# Patient Record
Sex: Female | Born: 1990 | Hispanic: Yes | Smoking: Never smoker
Health system: Southern US, Community
[De-identification: ages and names within clinical notes are randomized; demographics above are authoritative.]

## PROBLEM LIST (undated history)

## (undated) HISTORY — PX: ECTOPIC PREGNANCY SURGERY: SHX613

## (undated) HISTORY — PX: OTHER SURGICAL HISTORY: SHX169

---

## 2013-04-11 ENCOUNTER — Emergency Department: Payer: Self-pay | Admitting: Emergency Medicine

## 2013-04-11 LAB — CBC
HCT: 45.2 % (ref 35.0–47.0)
HGB: 15.9 g/dL (ref 12.0–16.0)
MCH: 30.9 pg (ref 26.0–34.0)
Platelet: 243 10*3/uL (ref 150–440)
RBC: 5.13 10*6/uL (ref 3.80–5.20)
RDW: 13.4 % (ref 11.5–14.5)
WBC: 8.4 10*3/uL (ref 3.6–11.0)

## 2013-04-11 LAB — URINALYSIS, COMPLETE
Bilirubin,UR: NEGATIVE
Blood: NEGATIVE
Glucose,UR: NEGATIVE mg/dL (ref 0–75)
Ketone: NEGATIVE
Nitrite: NEGATIVE
Ph: 6 (ref 4.5–8.0)
Specific Gravity: 1.02 (ref 1.003–1.030)
Squamous Epithelial: 2
WBC UR: <1 /HPF (ref 0–5)

## 2013-04-11 LAB — COMPREHENSIVE METABOLIC PANEL
Albumin: 3.9 g/dL (ref 3.4–5.0)
Bilirubin,Total: 0.3 mg/dL (ref 0.2–1.0)
Chloride: 104 mmol/L (ref 98–107)
Co2: 29 mmol/L (ref 21–32)
EGFR (Non-African Amer.): >60
Glucose: 111 mg/dL — ABNORMAL HIGH (ref 65–99)
Potassium: 3.5 mmol/L (ref 3.5–5.1)
SGOT(AST): 98 U/L — ABNORMAL HIGH (ref 15–37)
SGPT (ALT): 174 U/L — ABNORMAL HIGH (ref 12–78)
Sodium: 138 mmol/L (ref 136–145)
Total Protein: 8.2 g/dL (ref 6.4–8.2)

## 2013-04-11 LAB — LIPASE, BLOOD: Lipase: 113 U/L (ref 73–393)

## 2013-04-12 LAB — GC/CHLAMYDIA PROBE AMP

## 2015-09-19 ENCOUNTER — Encounter: Payer: Self-pay | Admitting: Obstetrics and Gynecology

## 2017-09-23 ENCOUNTER — Encounter: Payer: Self-pay | Admitting: Obstetrics & Gynecology

## 2017-09-23 ENCOUNTER — Ambulatory Visit (INDEPENDENT_AMBULATORY_CARE_PROVIDER_SITE_OTHER): Payer: Self-pay | Admitting: Obstetrics & Gynecology

## 2017-09-23 VITALS — BP 120/80 | HR 80 | Ht 65.0 in | Wt 250.0 lb

## 2017-09-23 DIAGNOSIS — Z124 Encounter for screening for malignant neoplasm of cervix: Secondary | ICD-10-CM

## 2017-09-23 DIAGNOSIS — Z3169 Encounter for other general counseling and advice on procreation: Secondary | ICD-10-CM | POA: Insufficient documentation

## 2017-09-23 DIAGNOSIS — Z6841 Body Mass Index (BMI) 40.0 and over, adult: Secondary | ICD-10-CM

## 2017-09-23 DIAGNOSIS — N970 Female infertility associated with anovulation: Secondary | ICD-10-CM | POA: Insufficient documentation

## 2017-09-23 MED ORDER — PHENTERMINE HCL 37.5 MG PO TABS: 37.5000 mg | ORAL_TABLET | Freq: Every day | ORAL | 0 refills | Status: DC

## 2017-09-23 NOTE — Patient Instructions (Signed)
Hysterosalpingography Hysterosalpingography is an X-ray exam of the womb (uterus) and fallopian tubes. It can help a doctor find out why a woman is not able to have kids. It can also find lumps (tumors) and other things that are not normal. The test lasts about 15-30 minutes. What happens before the procedure?  Schedule the test between day 5 and day 10 of your last period. Day 1 is the first day of your period.  Ask your doctor about changing or stopping your medicines.  Eat and drink as normal.  Pee (urinate) before the test begins. What happens during the procedure?  You may be given pain medicine or a medicine to relax you (sedative).  You will lie down on an X-ray table with your feet in stirrups.  A device (speculum) will be placed into your vagina. This lets your doctor see inside your vagina to the cervix.  Your cervix will be washed with a soap.  A thin tube will be passed through the cervix and into the womb.  X-ray dye (contrast) will be put into this tube.  X-rays will be taken of the womb and fallopian tubes.  The tube will be taken out. What happens after the procedure?  Most of the fluid will flow out on its own. Wear a pad if needed.  You may have cramping and a little bleeding. This should go away in 24 hours.  Ask when your test results will be ready. Make sure you get your test results. This information is not intended to replace advice given to you by your health care provider. Make sure you discuss any questions you have with your health care provider. Document Released: 08/16/2010 Document Revised: 12/20/2015 Document Reviewed: 01/14/2013  Phentermine tablets or capsules What is this medicine? PHENTERMINE (FEN ter meen) decreases your appetite. It is used with a reduced calorie diet and exercise to help you lose weight. This medicine may be used for other purposes; ask your health care provider or pharmacist if you have questions. COMMON BRAND NAME(S):  Adipex-P, Atti-Plex P, Atti-Plex P Spansule, Fastin, Lomaira, Pro-Fast, Tara-8 What should I tell my health care provider before I take this medicine? They need to know if you have any of these conditions: -agitation -glaucoma -heart disease -high blood pressure -history of substance abuse -lung disease called Primary Pulmonary Hypertension (PPH) -taken an MAOI like Carbex, Eldepryl, Marplan, Nardil, or Parnate in last 14 days -thyroid disease -an unusual or allergic reaction to phentermine, other medicines, foods, dyes, or preservatives -pregnant or trying to get pregnant -breast-feeding How should I use this medicine? Take this medicine by mouth with a glass of water. Follow the directions on the prescription label. The instructions for use may differ based on the product and dose you are taking. Avoid taking this medicine in the evening. It may interfere with sleep. Take your doses at regular intervals. Do not take your medicine more often than directed. Talk to your pediatrician regarding the use of this medicine in children. While this drug may be prescribed for children 17 years or older for selected conditions, precautions do apply. Overdosage: If you think you have taken too much of this medicine contact a poison control center or emergency room at once. NOTE: This medicine is only for you. Do not share this medicine with others. What if I miss a dose? If you miss a dose, take it as soon as you can. If it is almost time for your next dose, take only that dose. Do  not take double or extra doses. What may interact with this medicine? Do not take this medicine with any of the following medications: -duloxetine -MAOIs like Carbex, Eldepryl, Marplan, Nardil, and Parnate -medicines for colds or breathing difficulties like pseudoephedrine or phenylephrine -procarbazine -sibutramine -SSRIs like citalopram, escitalopram, fluoxetine, fluvoxamine, paroxetine, and sertraline -stimulants like  dexmethylphenidate, methylphenidate or modafinil -venlafaxine This medicine may also interact with the following medications: -medicines for diabetes This list may not describe all possible interactions. Give your health care provider a list of all the medicines, herbs, non-prescription drugs, or dietary supplements you use. Also tell them if you smoke, drink alcohol, or use illegal drugs. Some items may interact with your medicine. What should I watch for while using this medicine? Notify your physician immediately if you become short of breath while doing your normal activities. Do not take this medicine within 6 hours of bedtime. It can keep you from getting to sleep. Avoid drinks that contain caffeine and try to stick to a regular bedtime every night. This medicine was intended to be used in addition to a healthy diet and exercise. The best results are achieved this way. This medicine is only indicated for short-term use. Eventually your weight loss may level out. At that point, the drug will only help you maintain your new weight. Do not increase or in any way change your dose without consulting your doctor. You may get drowsy or dizzy. Do not drive, use machinery, or do anything that needs mental alertness until you know how this medicine affects you. Do not stand or sit up quickly, especially if you are an older patient. This reduces the risk of dizzy or fainting spells. Alcohol may increase dizziness and drowsiness. Avoid alcoholic drinks. What side effects may I notice from receiving this medicine? Side effects that you should report to your doctor or health care professional as soon as possible: -chest pain, palpitations -depression or severe changes in mood -increased blood pressure -irritability -nervousness or restlessness -severe dizziness -shortness of breath -problems urinating -unusual swelling of the legs -vomiting Side effects that usually do not require medical attention  (report to your doctor or health care professional if they continue or are bothersome): -blurred vision or other eye problems -changes in sexual ability or desire -constipation or diarrhea -difficulty sleeping -dry mouth or unpleasant taste -headache -nausea This list may not describe all possible side effects. Call your doctor for medical advice about side effects. You may report side effects to FDA at 1-800-FDA-1088. Where should I keep my medicine? Keep out of the reach of children. This medicine can be abused. Keep your medicine in a safe place to protect it from theft. Do not share this medicine with anyone. Selling or giving away this medicine is dangerous and against the law. This medicine may cause accidental overdose and death if taken by other adults, children, or pets. Mix any unused medicine with a substance like cat litter or coffee grounds. Then throw the medicine away in a sealed container like a sealed bag or a coffee can with a lid. Do not use the medicine after the expiration date. Store at room temperature between 20 and 25 degrees C (68 and 77 degrees F). Keep container tightly closed. NOTE: This sheet is a summary. It may not cover all possible information. If you have questions about this medicine, talk to your doctor, pharmacist, or health care provider.  2018 Elsevier/Gold Standard (2015-04-20 12:53:15)    Elsevier Interactive Patient Education  2017 Elsevier  Inc.  

## 2017-09-23 NOTE — Progress Notes (Signed)
Gynecology Infertility Exam  PCP: Patient, No Pcp Per  Chief Complaint:  Chief Complaint  Patient presents with  . Infertility   History of Present Illness: Patient is a 27 y.o. G1P0010 presenting for evaluation of infertility. Patient and partner have been attempting conception for several years. Marital Status: married for 2 or 3 years. Pregnancies with current partner yes    Pt had 2017 LEFT ECTOPIC PREGNANCY treated by Lap LS. Last few periods have been scanty bleeding but reg  Menstrual and Endocrine History LMP: Patient's last menstrual period was 09/16/2017. Menarche:13 Shortest Interval: 24 Longest Interval: 32  days Duration of flow: a few days Heavy Menses: no Clots: no Intermenstrual Bleeding: no Postcoital Bleeding: no Dysmenorrhea: no Amenorrhea: not applicable Wt Change: yes, started at young age problems w obesity; recent 10 lb weight loss (trying) Hirsutism: no Balding: no Acne: no Galactorrhea: no  Obstetrical History LEFT ECTOPIC 2017  Gynecologic History Last PAP: 2017 Previous abdominal or pelvic surgery: yes Pelvic Pain:  no Endometriosis: no Hot Flashes: no DES Exposure: no Abnormal Pap: no Cervix Cryo/cone: no STD: no PID: no  Infertility and Endocrine Studies BBT: no Endo. Bx.:no HSG: no PCT: no Laparoscopy: no Hormonal Studies: no Semen analysis: no Other Studies: no Meds: none Other Therapies: Not applicable Insemination:not applicable  Sexual History Frequency: several times per month(s) Satisfied: yes Dyspareunia: no Use of Lubricant: no Douching: no Number of lifetime sex partners: 1  Contraception None  Family History Thyroid Problems: no Heart Condition or High Blood Pressure: no Blood Clot or Stroke: no Diabetes: no Cancer: no Birth Defects/Inherited diseases:no Infectious diseases (mumps, TB, Rubella):no Other Medical Problems: no MR/autism/fragile X or POF: no  Habits Cigarettes:    Wife -  no  Husband - no Alcohol:    Wife -  no    Husband - no Marijuana: no  PMHx: She  has no past medical history on file. Also,  has a past surgical history that includes Ectopic pregnancy surgery and left tube removal., family history includes Breast cancer in her maternal aunt.,  reports that  has never smoked. she has never used smokeless tobacco. She reports that she does not drink alcohol or use drugs.  She has a current medication list which includes the following prescription(s): phentermine. Also, has No Known Allergies.  Review of Systems  Constitutional: Negative for chills, fever and malaise/fatigue.  HENT: Negative for congestion, sinus pain and sore throat.   Eyes: Negative for blurred vision and pain.  Respiratory: Negative for cough and wheezing.   Cardiovascular: Negative for chest pain and leg swelling.  Gastrointestinal: Negative for abdominal pain, constipation, diarrhea, heartburn, nausea and vomiting.  Genitourinary: Negative for dysuria, frequency, hematuria and urgency.  Musculoskeletal: Negative for back pain, joint pain, myalgias and neck pain.  Skin: Negative for itching and rash.  Neurological: Negative for dizziness, tremors and weakness.  Endo/Heme/Allergies: Does not bruise/bleed easily.  Psychiatric/Behavioral: Negative for depression. The patient is not nervous/anxious and does not have insomnia.     Objective: BP 120/80   Pulse 80   Ht 5\' 5"  (1.651 m)   Wt 250 lb (113.4 kg)   LMP 09/16/2017   BMI 41.60 kg/m  Physical Exam  Constitutional: She is oriented to person, place, and time. She appears well-developed and well-nourished. No distress.  Genitourinary: Rectum normal, vagina normal and uterus normal. Pelvic exam was performed with patient supine. There is no rash or lesion on the right labia. There is no rash or lesion  on the left labia. Vagina exhibits no lesion. No bleeding in the vagina. Right adnexum does not display mass and does not display  tenderness. Left adnexum does not display mass and does not display tenderness. Cervix does not exhibit motion tenderness, lesion, friability or polyp.   Uterus is mobile and midaxial. Uterus is not enlarged or exhibiting a mass.  HENT:  Head: Normocephalic and atraumatic. Head is without laceration.  Right Ear: Hearing normal.  Left Ear: Hearing normal.  Nose: No epistaxis.  No foreign bodies.  Mouth/Throat: Uvula is midline, oropharynx is clear and moist and mucous membranes are normal.  Eyes: Pupils are equal, round, and reactive to light.  Neck: Normal range of motion. Neck supple. No thyromegaly present.  Cardiovascular: Normal rate and regular rhythm. Exam reveals no gallop and no friction rub.  No murmur heard. Pulmonary/Chest: Effort normal and breath sounds normal. No respiratory distress. She has no wheezes. Right breast exhibits no mass, no skin change and no tenderness. Left breast exhibits no mass, no skin change and no tenderness.  Abdominal: Soft. Bowel sounds are normal. She exhibits no distension. There is no tenderness. There is no rebound.  Musculoskeletal: Normal range of motion.  Neurological: She is alert and oriented to person, place, and time. No cranial nerve deficit.  Skin: Skin is warm and dry.  Psychiatric: She has a normal mood and affect. Judgment normal.  Vitals reviewed.  Assessment: 27 y.o. G1P0010 1. Infertility associated with anovulation - DG Hysterogram (HSG); Future; especially due to prior tubal surgery and ectopic - DHEA-sulfate - FSH/LH - Hemoglobin A1c - Prolactin - Testosterone, Free, Total, SHBG - TSH  2. Class 3 severe obesity without serious comorbidity with body mass index (BMI) of 40.0 to 44.9 in adult, unspecified obesity type (HCC) - Weight loss counseled to help w ovulation, fertility, as well as future pregnancy - Meds also discussed - Phentermine counseled, to start - f/u ONE MONTH  3. Screening for cervical cancer - IGP, CtNg,  rfx Aptima HPV ASCU  Plan: 1) We discussed the underlying etiologies which may be implicated in a couple experiencing difficulty conceiving.  The average couple will conceive within the span of 1 year with unprotected coitus, with a monthly fecundity rate of 20% or 1 in 5.  Even without further work up or intervention the patient and her partner may be successful in conceiving unassisted, although if an underlying etiology can be identified and addressed fecundity rate may improve.  The work up entails examining for ovulatory function, tubal patency, and ruling out female factor infertility.  These may be looked at concurrently or sequentially.  The downside of sequential work up is that this method may miss issues if more than one compartment is contributing.  She is aware that tubal factor or moderate to severe female factor infertility will require further consultation with a reproductive endocrinologist.  In the case of anovulation, use of Clomid (clomiphen citrate) or Femara (letrazole) were discussed with the understanding the the later is an off-label, but well supported use.  With either of these drugs the risk of multiples increases from the standard population rate of 2% to approximately 10%, with higher order multiples possible but unlikely.  Both drugs may require some time to titrate to the appropriate dosage to ensure consistent ovulation.  Cycles will be limited to 6 cycles on each drug secondary to decreasing rates of conception after 6 cycles.  In addition should patient be started on ovulation induction with Clomid she  was advised to discontinue the drug for any vision changes as this is a rare but potentially permanent side-effect if medication is continued.  We discussed timing of intercourse as well as the use of ovulation predictor kits identify the patient's fertile window each month.     2) Preconception counseling - immunization up to date.  The patient denies any family history of  conditions which would warrant preconception genetic counseling or testing on her or her partner.  Instructed to start prenatal vitamins while trying to conceive.  Labs here as well to assess Rh, Rubella.  PAP today HSG soon Labs today or soon  Pt expresses concern as to cost of labs and procedures.  Will have done when she can.  Weight loss concentration emphasized.  Annamarie Major, MD, [facility], [facility] 09/23/2017  9:19 AM

## 2017-09-26 LAB — IGP, CTNG, RFX APTIMA HPV ASCU
CHLAMYDIA, NUC. ACID AMP: NEGATIVE
GONOCOCCUS BY NUCLEIC ACID AMP: NEGATIVE
PAP Smear Comment: 0

## 2017-10-05 ENCOUNTER — Ambulatory Visit: Admission: RE | Admit: 2017-10-05 | Payer: Self-pay | Source: Ambulatory Visit

## 2017-10-21 ENCOUNTER — Telehealth: Payer: Self-pay | Admitting: Obstetrics & Gynecology

## 2017-10-21 ENCOUNTER — Ambulatory Visit: Payer: Self-pay | Admitting: Obstetrics and Gynecology

## 2017-10-21 ENCOUNTER — Other Ambulatory Visit: Payer: Self-pay | Admitting: Obstetrics & Gynecology

## 2017-10-21 NOTE — Telephone Encounter (Signed)
-----   Message from Nadara Mustardobert P Harris, MD sent at 10/21/2017  2:34 PM EDT ----- Regarding: RE: CANCEL HSG/ REFILL REQUEST She needs appt before refill  ----- Message ----- From: Lewis Moccasinohen, Nancy F Sent: 10/21/2017   1:50 PM To: Nadara Mustardobert P Harris, MD Subject: CANCEL HSG/ REFILL REQUEST                     Patient called to cancel the HSG due to 1) she still has not started her period, and 2) cost. Patient requests a medication refill (she has about 10 pills left) but will be happy to come in if you need to see her first. Thanks.

## 2017-10-21 NOTE — Telephone Encounter (Signed)
Patient is aware and was offered the earliest available appointments on Monday, Thursday, and Friday of next week when Dr Tiburcio PeaHarris is in clinic, but will need something earlier. Patient will see what she can do and will call back tomorrow morning to schedule. Ext given.

## 2017-10-22 ENCOUNTER — Ambulatory Visit: Payer: Self-pay

## 2018-07-28 ENCOUNTER — Encounter: Payer: Self-pay | Admitting: Emergency Medicine

## 2018-07-28 ENCOUNTER — Other Ambulatory Visit: Payer: Self-pay

## 2018-07-28 DIAGNOSIS — Z79899 Other long term (current) drug therapy: Secondary | ICD-10-CM | POA: Insufficient documentation

## 2018-07-28 DIAGNOSIS — R1030 Lower abdominal pain, unspecified: Secondary | ICD-10-CM | POA: Diagnosis present

## 2018-07-28 DIAGNOSIS — M7918 Myalgia, other site: Secondary | ICD-10-CM | POA: Insufficient documentation
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
## 2018-07-29 ENCOUNTER — Emergency Department
Admission: EM | Admit: 2018-07-29 | Discharge: 2018-07-29 | Disposition: A | Discharge status: Home / Self Care | Payer: No Typology Code available for payment source | Attending: Emergency Medicine | Admitting: Emergency Medicine

## 2018-07-29 ENCOUNTER — Emergency Department: Payer: No Typology Code available for payment source

## 2018-07-29 DIAGNOSIS — M7918 Myalgia, other site: Secondary | ICD-10-CM

## 2018-07-29 LAB — POCT PREGNANCY, URINE: Preg Test, Ur: NEGATIVE
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
## 2022-05-22 ENCOUNTER — Other Ambulatory Visit: Payer: Self-pay

## 2022-05-22 ENCOUNTER — Emergency Department
Admission: EM | Admit: 2022-05-22 | Discharge: 2022-05-22 | Disposition: A | Discharge status: Home / Self Care | Payer: Self-pay | Attending: Emergency Medicine | Admitting: Emergency Medicine

## 2022-05-22 ENCOUNTER — Emergency Department: Payer: Self-pay

## 2022-05-22 ENCOUNTER — Encounter: Payer: Self-pay | Admitting: Emergency Medicine

## 2022-05-22 DIAGNOSIS — R1030 Lower abdominal pain, unspecified: Secondary | ICD-10-CM

## 2022-05-22 DIAGNOSIS — R1031 Right lower quadrant pain: Secondary | ICD-10-CM | POA: Insufficient documentation

## 2022-05-22 DIAGNOSIS — R110 Nausea: Secondary | ICD-10-CM | POA: Insufficient documentation

## 2022-05-22 LAB — CBC
HCT: 45.4 % (ref 36.0–46.0)
Hemoglobin: 15.6 g/dL — ABNORMAL HIGH (ref 12.0–15.0)
MCH: 29.9 pg (ref 26.0–34.0)
MCHC: 34.4 g/dL (ref 30.0–36.0)
MCV: 87.1 fL (ref 80.0–100.0)
Platelets: 255 10*3/uL (ref 150–400)
RBC: 5.21 MIL/uL — ABNORMAL HIGH (ref 3.87–5.11)
RDW: 13 % (ref 11.5–15.5)
WBC: 10.4 10*3/uL (ref 4.0–10.5)
nRBC: 0 % (ref 0.0–0.2)

## 2022-05-22 LAB — LIPASE, BLOOD: Lipase: 34 U/L (ref 11–51)

## 2022-05-22 LAB — COMPREHENSIVE METABOLIC PANEL
ALT: 77 U/L — ABNORMAL HIGH (ref 0–44)
AST: 63 U/L — ABNORMAL HIGH (ref 15–41)
Albumin: 4.3 g/dL (ref 3.5–5.0)
Alkaline Phosphatase: 74 U/L (ref 38–126)
Anion gap: 10 (ref 5–15)
BUN: 20 mg/dL (ref 6–20)
CO2: 21 mmol/L — ABNORMAL LOW (ref 22–32)
Calcium: 9.6 mg/dL (ref 8.9–10.3)
Chloride: 108 mmol/L (ref 98–111)
Creatinine, Ser: 0.82 mg/dL (ref 0.44–1.00)
GFR, Estimated: >60 mL/min (ref >60)
Glucose, Bld: 105 mg/dL — ABNORMAL HIGH (ref 70–99)
Potassium: 3.7 mmol/L (ref 3.5–5.1)
Sodium: 139 mmol/L (ref 135–145)
Total Bilirubin: 0.5 mg/dL (ref 0.3–1.2)
Total Protein: 7.8 g/dL (ref 6.5–8.1)

## 2022-05-22 LAB — POC URINE PREG, ED: Preg Test, Ur: NEGATIVE

## 2022-05-22 LAB — HCG, QUANTITATIVE, PREGNANCY: hCG, Beta Chain, Quant, S: <1 m[IU]/mL (ref <5)

## 2022-05-22 MED ORDER — SODIUM CHLORIDE 0.9 % IV BOLUS
1000.0000 mL | Freq: Once | INTRAVENOUS | Status: AC
  Administered 2022-05-22: 1000 mL via INTRAVENOUS

## 2022-05-22 MED ORDER — IOHEXOL 300 MG/ML  SOLN
100.0000 mL | Freq: Once | INTRAMUSCULAR | Status: AC | PRN
  Administered 2022-05-22: 100 mL via INTRAVENOUS

## 2022-05-22 NOTE — ED Provider Notes (Signed)
[facility] Provider Note  Patient Contact: 5:03 PM (approximate)   History   Abdominal Pain   HPI  Michelle Cherry is a 31 y.o. female with a history of prior ectopic, presents to the emergency department with right lower abdominal pain that radiates to the right and left upper quadrants.  Patient states that her symptoms started approximately 2 hours ago.  She reports that when the pain initially started, it was constant in nature and seems to radiate to the back.  She has had some nausea for the past several days but no associated vomiting or diarrhea.  She states that when she has her pain, she experiences shortness of breath.  No chest tightness or chest pain.  No lower extremity swelling.  She denies daily smoking, recent travel, prolonged immobilization or recent surgery.      Physical Exam   Triage Vital Signs: ED Triage Vitals  Enc Vitals Group     BP 05/22/22 1623 (!) 131/101     Pulse Rate 05/22/22 1623 (!) 104     Resp 05/22/22 1623 20     Temp 05/22/22 1623 98.6 F (37 C)     Temp Source 05/22/22 1623 Oral     SpO2 05/22/22 1623 96 %     Weight 05/22/22 1635 264 lb 15.9 oz (120.2 kg)     Height 05/22/22 1635 5\' 4"  (1.626 m)     Head Circumference --      Peak Flow --      Pain Score 05/22/22 1620 6     Pain Loc --      Pain Edu? --      Excl. in [place]? --     Most recent vital signs: Vitals:   05/22/22 1623 05/22/22 1942  BP: (!) 131/101 120/82  Pulse: (!) 104 88  Resp: 20 18  Temp: 98.6 F (37 C) 98.1 F (36.7 C)  SpO2: 96% 97%     General: Alert and in no acute distress. Eyes:  PERRL. EOMI. Head: No acute traumatic findings ENT:      Nose: No congestion/rhinnorhea.      Mouth/Throat: Mucous membranes are moist. Neck: No stridor. No cervical spine tenderness to palpation. Cardiovascular:  Good peripheral perfusion Respiratory: Normal respiratory effort without tachypnea or retractions. Lungs CTAB. Good air entry to the  bases with no decreased or absent breath sounds. Gastrointestinal: Bowel sounds 4 quadrants. Soft and nontender to palpation. No guarding or rigidity. No palpable masses. No distention. No CVA tenderness. Musculoskeletal: Full range of motion to all extremities.  Neurologic:  No gross focal neurologic deficits are appreciated.  Skin:   No rash noted Other:   ED Results / Procedures / Treatments   Labs (all labs ordered are listed, but only abnormal results are displayed) Labs Reviewed  COMPREHENSIVE METABOLIC PANEL - Abnormal; Notable for the following components:      Result Value   CO2 21 (*)    Glucose, Bld 105 (*)    AST 63 (*)    ALT 77 (*)    All other components within normal limits  CBC - Abnormal; Notable for the following components:   RBC 5.21 (*)    Hemoglobin 15.6 (*)    All other components within normal limits  LIPASE, BLOOD  HCG, QUANTITATIVE, PREGNANCY  URINALYSIS, ROUTINE W REFLEX MICROSCOPIC  POC URINE PREG, ED        RADIOLOGY  I personally viewed and evaluated these images as part of  my medical decision making, as well as reviewing the written report by the radiologist.  ED Provider Interpretation:  CT abdomen pelvis: Blood products versus endometrial mass Pelvic ultrasound: Thickened endometrium PROCEDURES:  Critical Care performed: No  Procedures   MEDICATIONS ORDERED IN ED: Medications  sodium chloride 0.9 % bolus 1,000 mL (1,000 mLs Intravenous New Bag/Given 05/22/22 1745)  iohexol (OMNIPAQUE) 300 MG/ML solution 100 mL (100 mLs Intravenous Contrast Given 05/22/22 1804)     IMPRESSION / MDM / ASSESSMENT AND PLAN / ED COURSE  I reviewed the triage vital signs and the nursing notes.                              Assessment and plan Abdominal pain 31 year old female presents to the emergency department with right lower abdominal pain radiates to the right and left upper quadrants.  Patient was hypertensive at triage and mildly  tachycardic but vital signs were otherwise reassuring.  On exam, patient was alert and nontoxic-appearing.  She did express tenderness to deep palpation along the right lower quadrant but had no guarding.   CBC, CMP, lipase was reassuring.  Beta-hCG was less than 1 and urine pregnancy test was negative.  CT abdomen pelvis indicates blood products versus endometrial mass.  No signs of endometrial mass on pelvic ultrasound.  Relayed results to patient and strongly suspect onset of menses.  Return precautions were given to return with new or worsening symptoms.  FINAL CLINICAL IMPRESSION(S) / ED DIAGNOSES   Final diagnoses:  Lower abdominal pain     Rx / DC Orders   ED Discharge Orders     None        Note:  This document was prepared using Dragon voice recognition software and may include unintentional dictation errors.   Vallarie Mare [place], Hershal Coria 05/22/22 2054    Nance Pear, MD 05/23/22 [phone]

## 2022-05-22 NOTE — ED Notes (Signed)
First nurse note-pt brought in via ems from home with abd pain.  Pt dx with yeast infection, taking meds.  Pt late with menses.  Hx ectopic pregnancy  pt tearful on arrival to ER.  Tylenol 650mg  given by ems.  Pt alert, in wheelchair.

## 2022-05-22 NOTE — ED Triage Notes (Signed)
Pt comes in EMS with abdominal pain on both sides radiating up under ribs.  Started about an hour ago.   Pt has had no n/v/d and has taken meds for a yeast infection.  Pt has cold sweats she reports.

## 2022-09-12 ENCOUNTER — Emergency Department: Payer: Self-pay

## 2022-09-12 ENCOUNTER — Emergency Department
Admission: EM | Admit: 2022-09-12 | Discharge: 2022-09-12 | Disposition: A | Discharge status: Home / Self Care | Payer: Self-pay | Attending: Emergency Medicine | Admitting: Emergency Medicine

## 2022-09-12 ENCOUNTER — Other Ambulatory Visit: Payer: Self-pay

## 2022-09-12 DIAGNOSIS — K760 Fatty (change of) liver, not elsewhere classified: Secondary | ICD-10-CM | POA: Insufficient documentation

## 2022-09-12 DIAGNOSIS — R1011 Right upper quadrant pain: Secondary | ICD-10-CM

## 2022-09-12 DIAGNOSIS — K8020 Calculus of gallbladder without cholecystitis without obstruction: Secondary | ICD-10-CM | POA: Insufficient documentation

## 2022-09-12 LAB — COMPREHENSIVE METABOLIC PANEL
ALT: 48 U/L — ABNORMAL HIGH (ref 0–44)
AST: 57 U/L — ABNORMAL HIGH (ref 15–41)
Albumin: 3.8 g/dL (ref 3.5–5.0)
Alkaline Phosphatase: 61 U/L (ref 38–126)
Anion gap: 7 (ref 5–15)
BUN: 17 mg/dL (ref 6–20)
CO2: 25 mmol/L (ref 22–32)
Calcium: 8.5 mg/dL — ABNORMAL LOW (ref 8.9–10.3)
Chloride: 105 mmol/L (ref 98–111)
Creatinine, Ser: 0.75 mg/dL (ref 0.44–1.00)
GFR, Estimated: >60 mL/min (ref >60)
Glucose, Bld: 107 mg/dL — ABNORMAL HIGH (ref 70–99)
Potassium: 4.3 mmol/L (ref 3.5–5.1)
Sodium: 137 mmol/L (ref 135–145)
Total Bilirubin: 0.8 mg/dL (ref 0.3–1.2)
Total Protein: 6.8 g/dL (ref 6.5–8.1)

## 2022-09-12 LAB — CBC
HCT: 43.2 % (ref 36.0–46.0)
Hemoglobin: 14.7 g/dL (ref 12.0–15.0)
MCH: 30.5 pg (ref 26.0–34.0)
MCHC: 34 g/dL (ref 30.0–36.0)
MCV: 89.6 fL (ref 80.0–100.0)
Platelets: 226 10*3/uL (ref 150–400)
RBC: 4.82 MIL/uL (ref 3.87–5.11)
RDW: 12.7 % (ref 11.5–15.5)
WBC: 6.5 10*3/uL (ref 4.0–10.5)
nRBC: 0 % (ref 0.0–0.2)

## 2022-09-12 LAB — LIPASE, BLOOD: Lipase: 33 U/L (ref 11–51)

## 2022-09-12 MED ORDER — OXYCODONE-ACETAMINOPHEN 5-325 MG PO TABS
1.0000 | ORAL_TABLET | Freq: Once | ORAL | Status: AC
  Administered 2022-09-12: 1 via ORAL
  Filled 2022-09-12: qty 1

## 2022-09-12 MED ORDER — SODIUM CHLORIDE 0.9 % IV BOLUS
500.0000 mL | Freq: Once | INTRAVENOUS | Status: AC
  Administered 2022-09-12: 500 mL via INTRAVENOUS

## 2022-09-12 MED ORDER — ONDANSETRON 4 MG PO TBDP
4.0000 mg | ORAL_TABLET | Freq: Once | ORAL | Status: AC
  Administered 2022-09-12: 4 mg via ORAL
  Filled 2022-09-12: qty 1

## 2022-09-12 MED ORDER — OXYCODONE-ACETAMINOPHEN 5-325 MG PO TABS: 1.0000 | ORAL_TABLET | ORAL | 0 refills | Status: AC | PRN

## 2022-09-12 MED ORDER — MORPHINE SULFATE (PF) 4 MG/ML IV SOLN
4.0000 mg | Freq: Once | INTRAVENOUS | Status: AC
  Administered 2022-09-12: 4 mg via INTRAVENOUS
  Filled 2022-09-12: qty 1

## 2022-09-12 MED ORDER — KETOROLAC TROMETHAMINE 15 MG/ML IJ SOLN
15.0000 mg | Freq: Once | INTRAMUSCULAR | Status: AC
  Administered 2022-09-12: 15 mg via INTRAVENOUS
  Filled 2022-09-12: qty 1

## 2022-09-12 MED ORDER — ONDANSETRON 4 MG PO TBDP: 4.0000 mg | ORAL_TABLET | Freq: Three times a day (TID) | ORAL | 0 refills | Status: AC | PRN

## 2022-09-12 NOTE — Discharge Instructions (Addendum)
You were seen in the emerged department for abdominal pain from gallstones.  You had findings of gallstones but no gallbladder infection.  It is importantly called general surgeon Dr. Christian Mate to schedule an outpatient surgery.  Return to the emergency department for fever, severe vomiting or worsening pain.  Is importantly do not eat any fatty foods as this would cause your pain to worsen since your gallbladder breaks down fats.  You are given information for gallbladder eating plan.  You are given a prescription for nausea medication.  Stay hydrated and drink plenty of fluids.  Pain control:  Ibuprofen (motrin/aleve/advil) - You can take 3-4 tablets (600-800 mg) every 6 hours as needed for pain/fever.  Acetaminophen (tylenol) - You can take 2 extra strength tablets (1000 mg) every 6 hours as needed for pain/fever.  You can alternate these medications or take them together.  Make sure you eat food/drink water when taking these medications.  If you are still having severe pain you can take a Percocet that was prescribed to you.  Percocet also has Tylenol in it so make sure you are not taking extra Tylenol with Percocet.  You were given a prescription for narcotic pain medications.  Take only if in severe pain.  These are very addictive medications.  These medications can make you constipated.  If you need to take more than 1-2 doses, start a stool softner.  If you become constipated, take 1 capfull of MiraLAX, can repeat untill having regular bowel movements.  Keep this medication out of reach of any children.

## 2022-09-12 NOTE — ED Notes (Signed)
Returned from U/S

## 2022-09-12 NOTE — ED Provider Notes (Signed)
[facility] Provider Note    Event Date/Time   First MD Initiated Contact with Patient 09/12/22 [phone]     (approximate)   History   Abdominal Pain   HPI  Michelle Cherry is a 32 y.o. female with no significant past medical history who presents to the emergency department with abdominal pain.  Abdominal pain started in her upper abdomen and right upper quadrant this morning.  States that it is a constant pain associate with nausea and vomiting.  Has not taken anything for the pain.  States that usually this pain subsides after couple of hours but this morning it continued.  States that she was evaluated approximately 1 month ago at [facility] and diagnosed with gallstones and given an outpatient surgery referral.  States that she has not yet been able to get an appointment.  Does have a primary care provider.  Ate a quesadilla last night.  Denies any fever or chills.  No dysuria, urinary urgency or frequency.  Denies any concern for pregnancy.  1 prior ectopic pregnancy.  No other prior abdominal surgeries.  Denies any significant alcohol use.  Denies any marijuana use.     Physical Exam   Triage Vital Signs: ED Triage Vitals  Enc Vitals Group     BP 09/12/22 0728 138/84     Pulse Rate 09/12/22 0728 86     Resp 09/12/22 0728 18     Temp 09/12/22 0728 (!) 97.5 F (36.4 C)     Temp src --      SpO2 09/12/22 0728 97 %     Weight --      Height --      Head Circumference --      Peak Flow --      Pain Score 09/12/22 0725 10     Pain Loc --      Pain Edu? --      Excl. in [place]? --     Most recent vital signs: Vitals:   09/12/22 0728 09/12/22 0750  BP: 138/84 114/88  Pulse: 86 79  Resp: 18 17  Temp: (!) 97.5 F (36.4 C)   SpO2: 97% 100%    Physical Exam Constitutional:      Appearance: She is well-developed.  HENT:     Head: Atraumatic.  Eyes:     Conjunctiva/sclera: Conjunctivae normal.  Cardiovascular:     Rate and Rhythm: Regular rhythm.   Pulmonary:     Effort: No respiratory distress.  Abdominal:     General: There is no distension.     Tenderness: There is abdominal tenderness in the right upper quadrant and epigastric area. Negative signs include Murphy's sign.  Musculoskeletal:        General: Normal range of motion.     Cervical back: Normal range of motion.  Skin:    General: Skin is warm.  Neurological:     Mental Status: She is alert. Mental status is at baseline.     IMPRESSION / MDM / ASSESSMENT AND PLAN / ED COURSE  I reviewed the triage vital signs and the nursing notes.  On chart review of outside records patient had an ultrasound done of her gallbladder on 1/18 that showed no biliary dilation, small layering of stones and sludge.  No gallbladder wall thickening or signs of acute cholecystitis.  Differential diagnosis including acute cholecystitis, symptomatic cholelithiasis, peptic ulcer disease, pancreatitis   EKG  No tachycardic or bradycardic dysrhythmias while on cardiac telemetry.  RADIOLOGY  I independently reviewed imaging, my interpretation of imaging: Right upper quadrant ultrasound -findings of cholelithiasis but no signs of acute cholecystitis.  Read as cholelithiasis but no signs of acute cholecystitis.  LABS (all labs ordered are listed, but only abnormal results are displayed) Labs interpreted as -    Labs Reviewed  COMPREHENSIVE METABOLIC PANEL - Abnormal; Notable for the following components:      Result Value   Glucose, Bld 107 (*)    Calcium 8.5 (*)    AST 57 (*)    ALT 48 (*)    All other components within normal limits  LIPASE, BLOOD  CBC  URINALYSIS, ROUTINE W REFLEX MICROSCOPIC  POC URINE PREG, ED    TREATMENT  500 cc normal saline IV, ODT Zofran, IV ketorolac  On reevaluation continued to have ongoing pain so given IV morphine  MDM    Patient had improvement of her pain following IV medications.  Given a p.o. Percocet.  Discussed the patient's case with  general surgery Dr. Christian Mate, recommended outpatient surgery follow-up and return precautions to the emergency department.  Will do a short course of Percocets.  Discussed at length diet for gallbladder disease.  Given return precautions.   PROCEDURES:  Critical Care performed: No  Procedures  Patient's presentation is most consistent with acute presentation with potential threat to life or bodily function.   MEDICATIONS ORDERED IN ED: Medications  oxyCODONE-acetaminophen (PERCOCET/ROXICET) 5-325 MG per tablet 1 tablet (has no administration in time range)  ondansetron (ZOFRAN-ODT) disintegrating tablet 4 mg (4 mg Oral Given 09/12/22 0732)  sodium chloride 0.9 % bolus 500 mL (500 mLs Intravenous New Bag/Given 09/12/22 0759)  ketorolac (TORADOL) 15 MG/ML injection 15 mg (15 mg Intravenous Given 09/12/22 0754)  morphine (PF) 4 MG/ML injection 4 mg (4 mg Intravenous Given 09/12/22 0902)    FINAL CLINICAL IMPRESSION(S) / ED DIAGNOSES   Final diagnoses:  RUQ abdominal pain  Calculus of gallbladder without cholecystitis without obstruction  Hepatic steatosis     Rx / DC Orders   ED Discharge Orders          Ordered    oxyCODONE-acetaminophen (PERCOCET) 5-325 MG tablet  Every 4 hours PRN        09/12/22 0921    ondansetron (ZOFRAN-ODT) 4 MG disintegrating tablet  Every 8 hours PRN        09/12/22 [id number]             Note:  This document was prepared using Dragon voice recognition software and may include unintentional dictation errors.   Nathaniel Man, MD 09/12/22 [phone]

## 2022-09-12 NOTE — ED Notes (Signed)
Pt to US.

## 2022-09-12 NOTE — ED Triage Notes (Signed)
Pt to ED via POV from home. Pt reports a few months ago was seen at [facility] and was told she has gallstones. Pt was referred out but they didn't have any availability and still has not been seen. Pt denies urinary symptoms.

## 2022-09-12 NOTE — ED Notes (Signed)
Pt wheeled to lobby in [facility] with all belongings and discharge papers, states mother is picking her up.  Pt in NAD.

## 2022-09-18 ENCOUNTER — Telehealth: Payer: Self-pay | Admitting: Surgery

## 2022-09-18 ENCOUNTER — Ambulatory Visit (INDEPENDENT_AMBULATORY_CARE_PROVIDER_SITE_OTHER): Payer: BLUE CROSS/BLUE SHIELD | Admitting: Surgery

## 2022-09-18 ENCOUNTER — Encounter: Payer: Self-pay | Admitting: Surgery

## 2022-09-18 VITALS — BP 117/84 | HR 86 | Temp 98.0°F | Ht 65.0 in | Wt 238.0 lb

## 2022-09-18 DIAGNOSIS — K8010 Calculus of gallbladder with chronic cholecystitis without obstruction: Secondary | ICD-10-CM | POA: Diagnosis not present

## 2022-09-18 NOTE — Patient Instructions (Signed)
You have requested to have your gallbladder removed. This will be done at [facility] with Dr. Christian Mate.  You will most likely be out of work 1-2 weeks for this surgery.  If you have FMLA or disability paperwork that needs filled out you may drop this off at our office or this can be faxed to [phone].  You will return after your post-op appointment with a lifting restriction for approximately 4 more weeks.  You will be able to eat anything you would like to following surgery. But, start by eating a bland diet and advance this as tolerated. The Gallbladder diet is below, please go as closely by this diet as possible prior to surgery to avoid any further attacks.  Please see the (blue)pre-care form that you have been given today. Our surgery scheduler will call you to verify surgery date and to go over information.   If you have any questions, please call our office.  Laparoscopic Cholecystectomy Laparoscopic cholecystectomy is surgery to remove the gallbladder. The gallbladder is located in the upper right part of the abdomen, behind the liver. It is a storage sac for bile, which is produced in the liver. Bile aids in the digestion and absorption of fats. Cholecystectomy is often done for inflammation of the gallbladder (cholecystitis). This condition is usually caused by a buildup of gallstones (cholelithiasis) in the gallbladder. Gallstones can block the flow of bile, and that can result in inflammation and pain. In severe cases, emergency surgery may be required. If emergency surgery is not required, you will have time to prepare for the procedure. Laparoscopic surgery is an alternative to open surgery. Laparoscopic surgery has a shorter recovery time. Your common bile duct may also need to be examined during the procedure. If stones are found in the common bile duct, they may be removed. LET [facility] CARE PROVIDER KNOW ABOUT: Any allergies you have. All medicines you are taking,  including vitamins, herbs, eye drops, creams, and over-the-counter medicines. Previous problems you or members of your family have had with the use of anesthetics. Any blood disorders you have. Previous surgeries you have had.  Any medical conditions you have. RISKS AND COMPLICATIONS Generally, this is a safe procedure. However, problems may occur, including: Infection. Bleeding. Allergic reactions to medicines. Damage to other structures or organs. A stone remaining in the common bile duct. A bile leak from the cyst duct that is clipped when your gallbladder is removed. The need to convert to open surgery, which requires a larger incision in the abdomen. This may be necessary if your surgeon thinks that it is not safe to continue with a laparoscopic procedure. BEFORE THE PROCEDURE Ask your health care provider about: Changing or stopping your regular medicines. This is especially important if you are taking diabetes medicines or blood thinners. Taking medicines such as aspirin and ibuprofen. These medicines can thin your blood. Do not take these medicines before your procedure if your health care provider instructs you not to. Follow instructions from your health care provider about eating or drinking restrictions. Let your health care provider know if you develop a cold or an infection before surgery. Plan to have someone take you home after the procedure. Ask your health care provider how your surgical site will be marked or identified. You may be given antibiotic medicine to help prevent infection. PROCEDURE To reduce your risk of infection: Your health care team will wash or sanitize their hands. Your skin will be washed with soap. An IV  tube may be inserted into one of your veins. You will be given a medicine to make you fall asleep (general anesthetic). A breathing tube will be placed in your mouth. The surgeon will make several small cuts (incisions) in your abdomen. A thin,  lighted tube (laparoscope) that has a tiny camera on the end will be inserted through one of the small incisions. The camera on the laparoscope will send a picture to a TV screen (monitor) in the operating room. This will give the surgeon a good view inside your abdomen. A gas will be pumped into your abdomen. This will expand your abdomen to give the surgeon more room to perform the surgery. Other tools that are needed for the procedure will be inserted through the other incisions. The gallbladder will be removed through one of the incisions. After your gallbladder has been removed, the incisions will be closed with stitches (sutures), staples, or skin glue. Your incisions may be covered with a bandage (dressing). The procedure may vary among health care providers and hospitals. AFTER THE PROCEDURE Your blood pressure, heart rate, breathing rate, and blood oxygen level will be monitored often until the medicines you were given have worn off. You will be given medicines as needed to control your pain.   This information is not intended to replace advice given to you by your health care provider. Make sure you discuss any questions you have with your health care provider.   Document Released: 07/14/2005 Document Revised: 04/04/2015 Document Reviewed: 02/23/2013 Elsevier Interactive Patient Education 2016 [place] Diet for Gallbladder Conditions A low-fat diet can be helpful if you have pancreatitis or a gallbladder condition. With these conditions, your pancreas and gallbladder have trouble digesting fats. A healthy eating plan with less fat will help rest your pancreas and gallbladder and reduce your symptoms. WHAT DO I NEED TO KNOW ABOUT THIS DIET? Eat a low-fat diet. Reduce your fat intake to less than 20-30% of your total daily calories. This is less than 50-60 g of fat per day. Remember that you need some fat in your diet. Ask your dietician what your daily goal should  be. Choose nonfat and low-fat healthy foods. Look for the words "nonfat," "low fat," or "fat free." As a guide, look on the label and choose foods with less than 3 g of fat per serving. Eat only one serving. Avoid alcohol. Do not smoke. If you need help quitting, talk with your health care provider. Eat small frequent meals instead of three large heavy meals. WHAT FOODS CAN I EAT? Grains Include healthy grains and starches such as potatoes, wheat bread, fiber-rich cereal, and brown rice. Choose whole grain options whenever possible. In adults, whole grains should account for 45-65% of your daily calories.  Fruits and Vegetables Eat plenty of fruits and vegetables. Fresh fruits and vegetables add fiber to your diet. Meats and Other Protein Sources Eat lean meat such as chicken and pork. Trim any fat off of meat before cooking it. Eggs, fish, and beans are other sources of protein. In adults, these foods should account for 10-35% of your daily calories. Dairy Choose low-fat milk and dairy options. Dairy includes fat and protein, as well as calcium.  Fats and Oils Limit high-fat foods such as fried foods, sweets, baked goods, sugary drinks.  Other Creamy sauces and condiments, such as mayonnaise, can add extra fat. Think about whether or not you need to use them, or use smaller amounts or low fat options.  WHAT FOODS ARE NOT RECOMMENDED? High fat foods, such as: [employer]. Ice cream. [place] toast. Sweet rolls. Pizza. Cheese bread. Foods covered with batter, butter, creamy sauces, or cheese. Fried foods. Sugary drinks and desserts. Foods that cause gas or bloating   This information is not intended to replace advice given to you by your health care provider. Make sure you discuss any questions you have with your health care provider.   Document Released: 07/19/2013 Document Reviewed: 07/19/2013 Elsevier Interactive Patient Education [employer].

## 2022-09-18 NOTE — Progress Notes (Signed)
Patient ID: Michelle Cherry, female   DOB: [birth year], 32 y.o.   MRN: [id number]  Chief Complaint: Gallstones, symptomatic  History of Present Illness Michelle Cherry is a 32 y.o. female with right upper quadrant pain that radiates to the right costal margin and back, occurring 1-2 times a week, beginning last October.  Initially thought secondary to stress or anger, now she knows it is related to her fatty food intake.  She has had some associated oil in her stools.  She has had a workup which shows gallstones with a 6 mm common bile duct.  Her LFTs are normal with the exception of a chronic elevation in her transaminases.  She denies any history of hepatitis, jaundice, hematemesis, melena or hematochezia.  Past Medical History No past medical history on file.    Past Surgical History:  Procedure Laterality Date   ECTOPIC PREGNANCY SURGERY     left tube removal      No Known Allergies  Current Outpatient Medications  Medication Sig Dispense Refill   ondansetron (ZOFRAN-ODT) 4 MG disintegrating tablet Take 1 tablet (4 mg total) by mouth every 8 (eight) hours as needed for nausea or vomiting. 30 tablet 0   No current facility-administered medications for this visit.    Family History Family History  Problem Relation Age of Onset   Breast cancer Maternal Aunt       Social History Social History   Tobacco Use   Smoking status: Never    Passive exposure: Never   Smokeless tobacco: Never  Vaping Use   Vaping Use: Some days  Substance Use Topics   Alcohol use: Yes   Drug use: No        Review of Systems  Constitutional:  Negative for chills and fever.  HENT: Negative.    Respiratory: Negative.    Cardiovascular:  Positive for palpitations.  Gastrointestinal:  Positive for abdominal pain, nausea and vomiting.  Genitourinary: Negative.   Skin: Negative.   Neurological: Negative.   Psychiatric/Behavioral: Negative.       Physical Exam Blood pressure 117/84, pulse 86,  temperature 98 F (36.7 C), height 5' 5"$  (1.651 m), weight 238 lb (108 kg), last menstrual period 08/24/2022, SpO2 97 %. Last Weight  Most recent update: 09/18/2022  9:56 AM    Weight  108 kg (238 lb)             CONSTITUTIONAL: Well developed, and nourished, appropriately responsive and aware without distress.   EYES: Sclera non-icteric.   EARS, NOSE, MOUTH AND THROAT:  The oropharynx is clear. Oral mucosa is pink and moist.    Hearing is intact to voice.  NECK: Trachea is midline, and there is no jugular venous distension.  LYMPH NODES:  Lymph nodes in the neck are not appreciated. RESPIRATORY:  Lungs are clear, and breath sounds are equal bilaterally.  Normal respiratory effort without pathologic use of accessory muscles. CARDIOVASCULAR: Heart is regular in rate and rhythm.   Well perfused.  GI: The abdomen is  soft, nontender, and nondistended. There were no palpable masses.  I did not appreciate hepatosplenomegaly.  MUSCULOSKELETAL:  Symmetrical muscle tone appreciated in all four extremities.    SKIN: Skin turgor is normal. No pathologic skin lesions appreciated.  NEUROLOGIC:  Motor and sensation appear grossly normal.  Cranial nerves are grossly without defect. PSYCH:  Alert and oriented to person, place and time. Affect is appropriate for situation.  Data Reviewed I have personally reviewed what is currently available of the patient's  imaging, recent labs and medical records.   Labs:     Latest Ref Rng & Units 09/12/2022    7:26 AM 05/22/2022    4:23 PM 04/11/2013    8:27 PM  CBC  WBC 4.0 - 10.5 K/uL 6.5  10.4  8.4   Hemoglobin 12.0 - 15.0 g/dL 14.7  15.6  15.9   Hematocrit 36.0 - 46.0 % 43.2  45.4  45.2   Platelets 150 - 400 K/uL 226  255  243      Imaging: Radiological images reviewed:  CLINICAL DATA:  RIGHT upper quadrant pain for several months   EXAM: ULTRASOUND ABDOMEN LIMITED RIGHT UPPER QUADRANT   COMPARISON:  None Available.   FINDINGS: Gallbladder:    Several dependent gallstones. Largest stone measures 6 mm. No gallbladder wall thickening or pericholecystic fluid. Negative sonographic Murphy's sign.   Common bile duct:   Diameter: Upper limits normal 6 mm.   Liver:   No focal lesion identified. Mild increased liver echogenicity. Portal vein is patent on color Doppler imaging with normal direction of blood flow towards the liver.   Other: None.   IMPRESSION: 1. Cholelithiasis without evidence acute cholecystitis. 2. Mildly increased liver echogenicity suggests hepatic steatosis.     Electronically Signed   By: Suzy Bouchard M.D.   On: 09/12/2022 08:58 Within last 24 hrs: No results found.  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  This was discussed thoroughly.  Optimal plan is for robotic cholecystectomy utilizing ICG imaging. Risks and benefits have been discussed with the patient which include but are not limited to anesthesia, bleeding, infection, biliary ductal injury, resulting in leak or stenosis, other associated unanticipated injuries affiliated with laparoscopic surgery.   Reviewed that removing the gallbladder will only address the symptoms related to the gallbladder itself.  I believe there is the desire to proceed, accepting the risks with understanding.  Questions elicited and answered to satisfaction.    No guarantees ever expressed or implied.  Now understand that we are not an in network provider, and she will defer proceeding with surgery at this time.  Face-to-face time spent with the patient and accompanying care providers(if present) was 40  minutes, with more than 50% of the time spent counseling, educating, and coordinating care of the patient.    These notes generated with voice recognition software. I apologize for typographical errors.  Ronny Bacon M.D., FACS 09/18/2022, 12:03 PM

## 2022-09-18 NOTE — Telephone Encounter (Signed)
Patient has been made aware that she will be responsible for anything she accumulates with [facility] as she has [facility].  She is aware that they will pay little to nothing.  Does not want to take this chance. Will contact her health insurance for guidance with general surgery with a [facility] provider. Patient will call us back to let us know what she wants to do, we can place referral to [facility] if needed.

## 2022-09-26 ENCOUNTER — Telehealth: Payer: Self-pay | Admitting: Surgery

## 2022-09-26 NOTE — Telephone Encounter (Signed)
Called patient regarding status of scheduling surgery.  Patient will pursue services with [facility] as [facility] is out of network with patient's health insurance.

## 2024-07-30 ENCOUNTER — Emergency Department: Payer: Self-pay

## 2024-07-30 ENCOUNTER — Emergency Department
Admission: EM | Admit: 2024-07-30 | Discharge: 2024-07-30 | Disposition: A | Discharge status: Home / Self Care | Payer: Self-pay | Source: Home / Self Care | Attending: Emergency Medicine | Admitting: Emergency Medicine

## 2024-07-30 ENCOUNTER — Encounter: Payer: Self-pay | Admitting: Emergency Medicine

## 2024-07-30 DIAGNOSIS — N939 Abnormal uterine and vaginal bleeding, unspecified: Secondary | ICD-10-CM | POA: Insufficient documentation

## 2024-07-30 DIAGNOSIS — R519 Headache, unspecified: Secondary | ICD-10-CM | POA: Insufficient documentation

## 2024-07-30 DIAGNOSIS — D649 Anemia, unspecified: Secondary | ICD-10-CM | POA: Insufficient documentation

## 2024-07-30 DIAGNOSIS — R079 Chest pain, unspecified: Secondary | ICD-10-CM

## 2024-07-30 DIAGNOSIS — R7989 Other specified abnormal findings of blood chemistry: Secondary | ICD-10-CM | POA: Insufficient documentation

## 2024-07-30 LAB — COMPREHENSIVE METABOLIC PANEL WITH GFR
ALT: 21 U/L (ref 0–44)
AST: 24 U/L (ref 15–41)
Albumin: 4.1 g/dL (ref 3.5–5.0)
Alkaline Phosphatase: 46 U/L (ref 38–126)
Anion gap: 9 (ref 5–15)
BUN: 17 mg/dL (ref 6–20)
CO2: 23 mmol/L (ref 22–32)
Calcium: 8.1 mg/dL — ABNORMAL LOW (ref 8.9–10.3)
Chloride: 104 mmol/L (ref 98–111)
Creatinine, Ser: 0.75 mg/dL (ref 0.44–1.00)
GFR, Estimated: >60 mL/min
Glucose, Bld: 104 mg/dL — ABNORMAL HIGH (ref 70–99)
Potassium: 3.7 mmol/L (ref 3.5–5.1)
Sodium: 137 mmol/L (ref 135–145)
Total Bilirubin: <0.2 mg/dL (ref 0.0–1.2)
Total Protein: 6.2 g/dL — ABNORMAL LOW (ref 6.5–8.1)

## 2024-07-30 LAB — CBC WITH DIFFERENTIAL/PLATELET
Abs Immature Granulocytes: 0.03 K/uL (ref 0.00–0.07)
Basophils Absolute: 0 K/uL (ref 0.0–0.1)
Basophils Relative: 1 %
Eosinophils Absolute: 0.1 K/uL (ref 0.0–0.5)
Eosinophils Relative: 1 %
HCT: 16.6 % — ABNORMAL LOW (ref 36.0–46.0)
Hemoglobin: 5.4 g/dL — ABNORMAL LOW (ref 12.0–15.0)
Immature Granulocytes: 1 %
Lymphocytes Relative: 39 %
Lymphs Abs: 2.3 K/uL (ref 0.7–4.0)
MCH: 28.7 pg (ref 26.0–34.0)
MCHC: 32.5 g/dL (ref 30.0–36.0)
MCV: 88.3 fL (ref 80.0–100.0)
Monocytes Absolute: 0.5 K/uL (ref 0.1–1.0)
Monocytes Relative: 8 %
Neutro Abs: 2.9 K/uL (ref 1.7–7.7)
Neutrophils Relative %: 50 %
Platelets: 295 K/uL (ref 150–400)
RBC: 1.88 MIL/uL — ABNORMAL LOW (ref 3.87–5.11)
RDW: 13.9 % (ref 11.5–15.5)
WBC: 5.8 K/uL (ref 4.0–10.5)
nRBC: 0.3 % — ABNORMAL HIGH (ref 0.0–0.2)

## 2024-07-30 LAB — ABO/RH: ABO/RH(D): O POS

## 2024-07-30 LAB — TROPONIN T, HIGH SENSITIVITY
Troponin T High Sensitivity: <15 ng/L (ref 0–19)
Troponin T High Sensitivity: <15 ng/L (ref 0–19)

## 2024-07-30 LAB — D-DIMER, QUANTITATIVE: D-Dimer, Quant: 0.96 ug{FEU}/mL — ABNORMAL HIGH (ref 0.00–0.50)

## 2024-07-30 LAB — LIPASE, BLOOD: Lipase: 35 U/L (ref 11–51)

## 2024-07-30 LAB — PREPARE RBC (CROSSMATCH)

## 2024-07-30 MED ORDER — IOHEXOL 350 MG/ML SOLN
100.0000 mL | Freq: Once | INTRAVENOUS | Status: AC | PRN
  Administered 2024-07-30: 100 mL via INTRAVENOUS

## 2024-07-30 MED ORDER — ONDANSETRON HCL 4 MG/2ML IJ SOLN
4.0000 mg | Freq: Once | INTRAMUSCULAR | Status: DC
  Filled 2024-07-30: qty 2

## 2024-07-30 MED ORDER — MORPHINE SULFATE (PF) 4 MG/ML IV SOLN
4.0000 mg | Freq: Once | INTRAVENOUS | Status: DC
  Filled 2024-07-30: qty 1

## 2024-07-30 MED ORDER — SODIUM CHLORIDE 0.9 % IV SOLN: 10.0000 mL/h | Freq: Once | INTRAVENOUS | Status: DC

## 2024-07-30 NOTE — ED Triage Notes (Signed)
 Patient arrived by [facility] with complaints of burning chest pain, headache and lab results showing Hgb 5.7.  Surgery planned on Friday to have fibroids removed. Chest pain and headache started about 2 days ago.

## 2024-07-30 NOTE — ED Provider Notes (Signed)
 "  [facility] Provider Note    None    (approximate)   History   Chest Pain   HPI  Michelle Cherry is a 34 y.o. female with abnormal uterine bleeding secondary to large fibroids followed by gynecology in [place] with scheduled myomectomy on 08/05/2024, currently on OCPs who presents to the emergency department with 2 days of headache, chest pain shortness of breath headache and continued bleeding.  She states that she had preop labs completed yesterday along with a pelvic ultrasound (unable to see the results) that ultimately demonstrated a hemoglobin of 5.7.  Denies any cough congestion abdominal pain nausea or vomiting.  Denies the possibility of sexually transmitted diseases and does not want further testing      Physical Exam   Triage Vital Signs: ED Triage Vitals  Encounter Vitals Group     BP      Girls Systolic BP Percentile      Girls Diastolic BP Percentile      Boys Systolic BP Percentile      Boys Diastolic BP Percentile      Pulse      Resp      Temp      Temp src      SpO2      Weight      Height      Head Circumference      Peak Flow      Pain Score      Pain Loc      Pain Education      Exclude from Growth Chart     Most recent vital signs: Vitals:   07/30/24 0515 07/30/24 0530  BP: 127/75 (!) 104/54  Pulse: (!) 103 (!) 103  Resp: 13 18  Temp:    SpO2: 100% 100%    Nursing Triage Note reviewed. Vital signs reviewed and patients oxygen saturation is normoxic  General: Patient is well nourished, well developed, awake and alert, resting comfortably in no acute distress Head: Normocephalic and atraumatic Eyes: Normal inspection, extraocular muscles intact, no conjunctival pallor Ear, nose, throat: Normal external exam Neck: Normal range of motion Respiratory: Patient is in no respiratory distress, lungs CTAB Cardiovascular: Patient is not tachycardic, RRR without murmur appreciated GI: Abd SNT with no guarding or rebound   Back: Normal inspection of the back with good strength and range of motion throughout all ext Extremities: pulses intact with good cap refills, no LE pitting edema or calf tenderness Neuro: The patient is alert and oriented to person, place, and time, appropriately conversive, with 5/5 bilat UE/LE strength, no gross motor or sensory defects noted. Coordination appears to be adequate. Skin: Warm, dry, and intact Psych: normal mood and affect, no SI or HI  ED Results / Procedures / Treatments   Labs (all labs ordered are listed, but only abnormal results are displayed) Labs Reviewed  CBC WITH DIFFERENTIAL/PLATELET - Abnormal; Notable for the following components:      Result Value   RBC 1.88 (*)    Hemoglobin 5.4 (*)    HCT 16.6 (*)    nRBC 0.3 (*)    All other components within normal limits  COMPREHENSIVE METABOLIC PANEL WITH GFR - Abnormal; Notable for the following components:   Glucose, Bld 104 (*)    Calcium 8.1 (*)    Total Protein 6.2 (*)    All other components within normal limits  D-DIMER, QUANTITATIVE - Abnormal; Notable for the following components:   D-Dimer, Quant 0.96 (*)  All other components within normal limits  LIPASE, BLOOD  URINALYSIS, COMPLETE (UACMP) WITH MICROSCOPIC  POC URINE PREG, ED  TYPE AND SCREEN  PREPARE RBC (CROSSMATCH)  ABO/RH  TROPONIN T, HIGH SENSITIVITY  TROPONIN T, HIGH SENSITIVITY     EKG EKG and rhythm strip are interpreted by myself:   EKG: tachycardic sinus rhythm] at heart rate of 102, normal QRS duration, QTc 465, nonspecific ST segments and T waves no ectopy EKG not consistent with Acute STEMI Rhythm strip: tachycardic sinus rhythm in lead II   RADIOLOGY Cxr: No acute abnormality on my independent review interpretation and radiologist agrees CT angio chest: Pending    PROCEDURES:  Critical Care performed: Yes, see critical care procedure note(s)  .Critical Care  Performed by: Nicholaus Rolland BRAVO, MD Authorized by:  Nicholaus Rolland BRAVO, MD   Critical care provider statement:    Critical care time (minutes):  32   Critical care was necessary to treat or prevent imminent or life-threatening deterioration of the following conditions:  Circulatory failure   Critical care was time spent personally by me on the following activities:  Development of treatment plan with patient or surrogate, discussions with consultants, evaluation of patient's response to treatment, examination of patient, ordering and review of laboratory studies, ordering and review of radiographic studies, ordering and performing treatments and interventions, pulse oximetry, re-evaluation of patient's condition and review of old charts Comments:     Profound anemia requiring 2 units of packed red blood cells and frequent reassessments    MEDICATIONS ORDERED IN ED: Medications  morphine  (PF) 4 MG/ML injection 4 mg (4 mg Intravenous Not Given 07/30/24 0532)  ondansetron  (ZOFRAN ) injection 4 mg (4 mg Intravenous Not Given 07/30/24 0541)  0.9 %  sodium chloride  infusion (has no administration in time range)  iohexol  (OMNIPAQUE ) 350 MG/ML injection 100 mL (has no administration in time range)     IMPRESSION / MDM / ASSESSMENT AND PLAN / ED COURSE                                Differential diagnosis includes, but is not limited to, acute anemia, atypical ACS, pneumonia, pulmonary embolism, uterine fibroid  ED course: Patient presents tachycardic and low suspicion that symptoms are secondary to acute anemia however she cannot be PERC out given ongoing use of OCPs and tachycardia.  D-dimer was obtained which was elevated and patient is undergoing a CTA PE.  Chest x-ray was negative for any pneumonia.  EKG demonstrated no evidence of acute ischemia despite several days of symptoms her troponin is not elevated.  Patient is profoundly anemic with a hemoglobin of 5.4.  With her permission 2 units of packed red blood cells were ordered.  A pelvic ultrasound  was initially ordered for the patient however patient declined this stating that she will follow-up with her OB/GYN team.  Regarding patient's headache, she is neurovascularly intact without any evidence of meningismus on exam.  We are both in agreement that CT head and lumbar puncture is not indicated today.  If CTA PE is negative and patient feels improved after 2 units of packed red blood cells, patient can likely be discharged home with close follow-up.  Patient will be signed out to oncoming physician at 7 AM   Clinical Course as of 07/30/24 0641  Sat Jul 30, 2024  0520 Pulse Rate(!): 109 [HD]  0535 Patient declines morphine  and a repeat ultrasound [HD]  0535 Hemoglobin(!): 5.4 Hemoglobin is low.  I already discussed the indications benefits risks and alternatives of requiring blood transfusion with the patient and she voiced understanding.  Will give her 2 units of blood [HD]  0605 Troponin T High Sensitivity: <15 Not elevated [HD]    Clinical Course User Index [HD] Nicholaus Rolland BRAVO, MD   -- Risk: 5 This patient has a high risk of morbidity due to further diagnostic testing or treatment. Rationale: This patients evaluation and management involve a high risk of morbidity due to the potential severity of presenting symptoms, need for diagnostic testing, and/or initiation of treatment that may require close monitoring. The differential includes conditions with potential for significant deterioration or requiring escalation of care. Treatment decisions in the ED, including medication administration, procedural interventions, or disposition planning, reflect this level of risk. COPA: 5 The patient has the following acute or chronic illness/injury that poses a possible threat to life or bodily function: [X] : The patient has a potentially serious acute condition or an acute exacerbation of a chronic illness requiring urgent evaluation and management in the Emergency Department. The clinical  presentation necessitates immediate consideration of life-threatening or function-threatening diagnoses, even if they are ultimately ruled out.   FINAL CLINICAL IMPRESSION(S) / ED DIAGNOSES   Final diagnoses:  Acute anemia  Chest pain, unspecified type  Abnormal uterine bleeding  Nonintractable headache, unspecified chronicity pattern, unspecified headache type     Rx / DC Orders   ED Discharge Orders     None        Note:  This document was prepared using Dragon voice recognition software and may include unintentional dictation errors.   Nicholaus Rolland BRAVO, MD 07/30/24 [phone]  "

## 2024-07-30 NOTE — ED Notes (Signed)
 Patient refused Zofran , Morphine , and US  at this time. MD made aware.

## 2024-07-30 NOTE — ED Provider Notes (Signed)
 Emergency department handoff note  Care of this patient was signed out to me at the end of the previous provider shift.  All pertinent patient information was conveyed and all questions were answered.  Patient pending blood transfusion.  Patient has no complications throughout transfusion and feels much better upon completion.  Patient stable for discharge home at this time with outpatient follow-up.  Patient given strict return precautions and all questions answered prior to discharge.  Dispo: Discharge home with PCP follow-up   Zetha Kuhar K, MD 07/30/24 [phone]

## 2024-07-30 NOTE — ED Notes (Signed)
Consent for blood transfusion signed at this time.

## 2024-07-31 LAB — BPAM RBC
Blood Product Expiration Date: 202601282359
Blood Product Expiration Date: 202601302359
ISSUE DATE / TIME: 202601030911
ISSUE DATE / TIME: 202601031226
Unit Type and Rh: 5100
Unit Type and Rh: 5100

## 2024-07-31 LAB — TYPE AND SCREEN
ABO/RH(D): O POS
Antibody Screen: NEGATIVE
Unit division: 0
Unit division: 0

## 2024-08-05 ENCOUNTER — Encounter: Admission: RE | Payer: Self-pay

## 2024-08-05 ENCOUNTER — Inpatient Hospital Stay (HOSPITAL_COMMUNITY): Admission: RE | Admit: 2024-08-05 | Payer: Self-pay | Admitting: Obstetrics

## 2024-08-05 SURGERY — MYOMECTOMY, ABDOMINAL APPROACH
Anesthesia: Choice

## 2024-08-12 ENCOUNTER — Other Ambulatory Visit: Payer: Self-pay | Admitting: Obstetrics

## 2024-10-13 ENCOUNTER — Inpatient Hospital Stay (HOSPITAL_COMMUNITY): Admit: 2024-10-13 | Admitting: Obstetrics

## 2024-10-13 SURGERY — MYOMECTOMY, ABDOMINAL APPROACH
Anesthesia: Choice
